# Patient Record
Sex: Male | Born: 2000 | Race: Black or African American | Hispanic: No | Marital: Single
Health system: Southern US, Community
[De-identification: ages and names within clinical notes are randomized; demographics above are authoritative.]

## PROBLEM LIST (undated history)

## (undated) DIAGNOSIS — L309 Dermatitis, unspecified: Secondary | ICD-10-CM

## (undated) HISTORY — DX: Dermatitis, unspecified: L30.9

---
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
## 2021-04-12 ENCOUNTER — Encounter (HOSPITAL_COMMUNITY): Payer: Self-pay

## 2021-04-12 ENCOUNTER — Emergency Department (HOSPITAL_COMMUNITY)
Admission: EM | Admit: 2021-04-12 | Discharge: 2021-04-12 | Disposition: A | Discharge status: Home / Self Care | Payer: Medicaid Other | Attending: Emergency Medicine | Admitting: Emergency Medicine

## 2021-04-12 ENCOUNTER — Other Ambulatory Visit: Payer: Self-pay

## 2021-04-12 DIAGNOSIS — S0990XA Unspecified injury of head, initial encounter: Secondary | ICD-10-CM | POA: Diagnosis not present

## 2021-04-12 DIAGNOSIS — Y9367 Activity, basketball: Secondary | ICD-10-CM | POA: Insufficient documentation

## 2021-04-12 DIAGNOSIS — Z7722 Contact with and (suspected) exposure to environmental tobacco smoke (acute) (chronic): Secondary | ICD-10-CM | POA: Diagnosis not present

## 2021-04-12 DIAGNOSIS — W500XXA Accidental hit or strike by another person, initial encounter: Secondary | ICD-10-CM | POA: Diagnosis not present

## 2021-04-12 DIAGNOSIS — J45909 Unspecified asthma, uncomplicated: Secondary | ICD-10-CM | POA: Diagnosis not present

## 2021-04-12 DIAGNOSIS — Z7951 Long term (current) use of inhaled steroids: Secondary | ICD-10-CM | POA: Diagnosis not present

## 2021-04-12 NOTE — ED Provider Notes (Signed)
Emergency Medicine Provider Triage Evaluation Note  Bob Riley , a 20 y.o. male  was evaluated in triage.  Pt complains of headache.  He was headbutted while playing basketball today.  Nausea when smells food.  No LOC, no blood thinners, no vomiting.  Feels dizzy.   Review of Systems  Positive: Headache behind both eyes Negative: LOC, seizure, blood thinners  Physical Exam  BP 137/70 (BP Location: Right Arm)   Pulse 84   Temp 98.6 F (37 C) (Oral)   Resp 17   SpO2 100%  Gen:   Awake, no distress   Resp:  Normal effort  MSK:   Moves extremities without difficulty  Other:  Speech is not slurred.  Interacts appropriately  Medical Decision Making  Medically screening exam initiated at 1:42 PM.  Appropriate orders placed.  Bob Riley was informed that the remainder of the evaluation will be completed by another provider, this initial triage assessment does not replace that evaluation, and the importance of remaining in the ED until their evaluation is complete.     Cristina Gong, PA-C 04/12/21 1343    Little, Ambrose Finland, MD 04/15/21 [phone]

## 2021-04-12 NOTE — Discharge Instructions (Signed)
  You were evaluated in the Emergency Department and after careful evaluation, we did not find any emergent condition requiring admission or further testing in the hospital.   Your exam/testing today was overall reassuring.  Symptoms seem to be due to  head trauma.  Please return to the Emergency Department if you experience any worsening of your condition.  Thank you for allowing [place] to be a part of your care. Please speak to your pharmacist about any new medications prescribed today in regards to side effects or interactions with other medications.   Get help right away if: You have: A severe headache that is not helped by medicine. Trouble walking or weakness in your arms and legs. Clear or bloody fluid coming from your nose or ears. Changes in your vision. A seizure. Increased confusion or irritability. Your symptoms get worse. You are sleepier than normal and have trouble staying awake. You lose your balance. Your pupils change size. Your speech is slurred. Your dizziness gets worse. You vomit.

## 2021-04-12 NOTE — ED Provider Notes (Signed)
[facility] EMERGENCY DEPARTMENT Provider Note   CSN: [id number] Arrival date & time: 04/12/21  1318     History Chief Complaint  Patient presents with  . Head Injury    Bob Riley is a 20 y.o. male with no pertinent past medical history that presents to the emerge department today for head injury.  Patient states that he was playing basketball 6 hours ago, was hit on the top aspect of his right head by another player.  Both players hit their head simultaneously.  No laceration.  Denies any loss of consciousness, amnesia, nausea, vomiting, vision changes.  States that he did have some blurry vision after the impact but he feels fine now.  Denies any presyncopal like episode.  No other injuries.  Denies any numbness, tingling, gait abnormality.  Denies any alcohol use or blood thinners.  Currently states that he has a mild headache from where he got head, otherwise asymptomatic.  HPI     Past Medical History:  Diagnosis Date  . Allergic rhinitis   . Asthma   . Eczema     Patient Active Problem List   Diagnosis Date Noted  . 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 ALLERGIC RHINITIS 04/21/2009  . Asthma 02/07/2007  . GASTROESOPHAGEAL REFLUX, NO ESOPHAGITIS 02/07/2007  . Eczema 02/07/2007    History reviewed. No pertinent surgical history.     Family History  Problem Relation Age of Onset  . Asthma Mother   . Hypertension Mother   . Diabetes Father   . Hypertension Father   . Hyperlipidemia Father   . Obesity Father   . Hypertension Maternal Grandfather   . Hypertension Paternal Grandmother     Social History   Tobacco Use  . Smoking status: Passive Smoke Exposure - Never Smoker  . Smokeless tobacco: Never Used  . Tobacco comment: Mom and Dad smoke in the house.  They do not smoke in the car.  Substance Use Topics  . Drug use: No    Home Medications Prior to Admission medications   Medication Sig Start Date End Date Taking? Authorizing  Provider  albuterol (PROAIR HFA) 108 (90 Base) MCG/ACT inhaler IHALE 2 PUFFS EVERY 4 HOURS AS NEEDED FOR WHEEZING SHORTNESS OF BREATH (400/12=34) 03/19/19   Myrene Buddy, MD  albuterol (PROVENTIL) (2.5 MG/3ML) 0.083% nebulizer solution Take 3 mLs (2.5 mg total) by nebulization every 4 (four) hours as needed for wheezing or shortness of breath. 05/07/18   Blane Ohara, MD  beclomethasone (QVAR) 80 MCG/ACT inhaler Inhale 2 puffs into the lungs 2 (two) times daily. 04/19/18   Marquette Saa, MD  budesonide (PULMICORT) 1 MG/2ML nebulizer solution Take 2 mLs (1 mg total) by nebulization daily. 05/07/18   Blane Ohara, MD  cetirizine (ZYRTEC) 10 MG tablet Take 1 tablet (10 mg total) by mouth daily. 04/19/18   Marquette Saa, MD  ibuprofen (ADVIL,MOTRIN) 600 MG tablet Take 1 tablet (600 mg total) by mouth every 6 (six) hours as needed for moderate pain. 10/28/18   Lowanda Foster, NP    Allergies    Amoxicillin  Review of Systems   Review of Systems  Constitutional: Negative for diaphoresis, fatigue and fever.  Eyes: Negative for visual disturbance.  Respiratory: Negative for shortness of breath.   Cardiovascular: Negative for chest pain.  Gastrointestinal: Negative for nausea and vomiting.  Musculoskeletal: Negative for back pain and myalgias.  Skin: Negative for color change, pallor, rash and wound.  Neurological: Positive for headaches. Negative for syncope,  weakness, light-headedness and numbness.  Psychiatric/Behavioral: Negative for behavioral problems and confusion.    Physical Exam Updated Vital Signs BP 137/70 (BP Location: Right Arm)   Pulse 84   Temp 98.6 F (37 C) (Oral)   Resp 17   SpO2 100%   Physical Exam Constitutional:      General: He is not in acute distress.    Appearance: Normal appearance. He is not ill-appearing, toxic-appearing or diaphoretic.  HENT:     Head: Normocephalic and atraumatic.     Mouth/Throat:     Mouth: Mucous membranes are  moist.     Pharynx: Oropharynx is clear.  Eyes:     Extraocular Movements: Extraocular movements intact.     Pupils: Pupils are equal, round, and reactive to light.  Cardiovascular:     Rate and Rhythm: Normal rate and regular rhythm.     Pulses: Normal pulses.  Pulmonary:     Effort: Pulmonary effort is normal.     Breath sounds: Normal breath sounds.  Abdominal:     General: Abdomen is flat. There is no distension.     Palpations: Abdomen is soft.     Tenderness: There is no abdominal tenderness.  Musculoskeletal:        General: Normal range of motion.     Cervical back: Normal range of motion.     Comments: No cervical, thoracic or lumbar midline tenderness.  Lymphadenopathy:     Cervical: No cervical adenopathy.  Skin:    General: Skin is warm and dry.     Capillary Refill: Capillary refill takes less than 2 seconds.  Neurological:     General: No focal deficit present.     Mental Status: He is alert and oriented to person, place, and time.     Comments: Alert. Clear speech. No facial droop. CNIII-XII grossly intact. Bilateral upper and lower extremities' sensation grossly intact. 5/5 symmetric strength with grip strength and with plantar and dorsi flexion bilaterally.  . Normal finger to nose bilaterally. Negative pronator drift. Negative Romberg sign. Gait is steady and intact    Psychiatric:        Mood and Affect: Mood normal.        Behavior: Behavior normal.        Thought Content: Thought content normal.     ED Results / Procedures / Treatments   Labs (all labs ordered are listed, but only abnormal results are displayed) Labs Reviewed - No data to display  EKG None  Radiology No results found.  Procedures Procedures   Medications Ordered in ED Medications - No data to display  ED Course  I have reviewed the triage vital signs and the nursing notes.  Pertinent labs & imaging results that were available during my care of the patient were reviewed by  me and considered in my medical decision making (see chart for details).    MDM Rules/Calculators/A&P                          20 year old male presents with head injury.  Normal neuro exam, no obvious trauma to head.  No need for CT imaging at this time, [place] head CT 0.  Patient without any red flag symptoms, to be discharged at this time.  Symptomatic treatment discussed and follow-up recommended.  Patient does not want any Tylenol right now.  Doubt need for further emergent work up at this time. I explained the diagnosis and have given explicit  precautions to return to the ER including for any other new or worsening symptoms. The patient understands and accepts the medical plan as it's been dictated and I have answered their questions. Discharge instructions concerning home care and prescriptions have been given. The patient is STABLE and is discharged to home in good condition.   Final Clinical Impression(s) / ED Diagnoses Final diagnoses:  Injury of head, initial encounter    Rx / DC Orders ED Discharge Orders    None       Farrel Gordon, PA-C 04/12/21 1741    Arby Barrette, MD 04/13/21 1554

## 2021-04-12 NOTE — ED Notes (Signed)
Pt c/o pain to top aspect of head and R side of neck. Pt states he was playing basketball approximately 6 hours prior. He and another player collided and stuck each others heads. Pt denies any LOC, or amnesia, but did state he had some blurry vision after the impact that is resolved now. Pt denied any nausea or vomiting. Pt does state he feels dizzy sometimes when he stands up.

## 2021-04-12 NOTE — ED Triage Notes (Signed)
Pt reports around 10am he was playing baseball and head butted another player.Pt reports nausea,headpain, blurred vision.pt is a&ox4 during triage.neuro intact.
